# Patient Record
Sex: Female | Born: 1950 | Race: White | Hispanic: Refuse to answer | Marital: Single | Smoking: Current some day smoker
Health system: Southern US, Community
[De-identification: ages and names within clinical notes are randomized; demographics above are authoritative.]

## PROBLEM LIST (undated history)

## (undated) DIAGNOSIS — T753XXA Motion sickness, initial encounter: Secondary | ICD-10-CM

## (undated) DIAGNOSIS — R0600 Dyspnea, unspecified: Secondary | ICD-10-CM

## (undated) DIAGNOSIS — Z972 Presence of dental prosthetic device (complete) (partial): Secondary | ICD-10-CM

## (undated) HISTORY — PX: ABDOMINAL HYSTERECTOMY: SHX81

---

## 2004-12-07 ENCOUNTER — Ambulatory Visit: Payer: Self-pay | Admitting: Family Medicine

## 2004-12-29 ENCOUNTER — Ambulatory Visit: Payer: Self-pay | Admitting: Family Medicine

## 2007-03-28 IMAGING — US ABDOMEN ULTRASOUND
1 series · 17 of 25 positions shown · non-contrast
Comparison: none

REASON FOR EXAM: Abdominal pain
COMMENTS:

[Series 1: abdomen ultrasound · 17 of 57 slices shown]
[im 1/57]
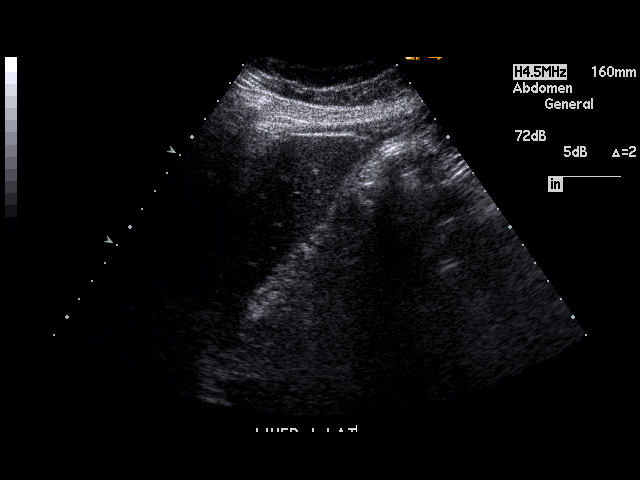
[im 5/57]
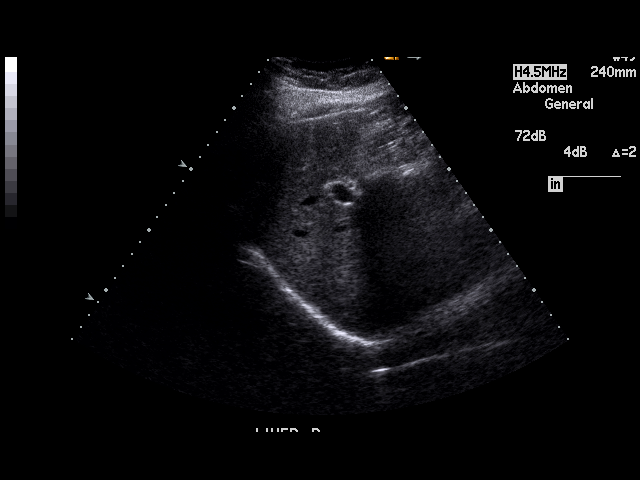
[im 8/57]
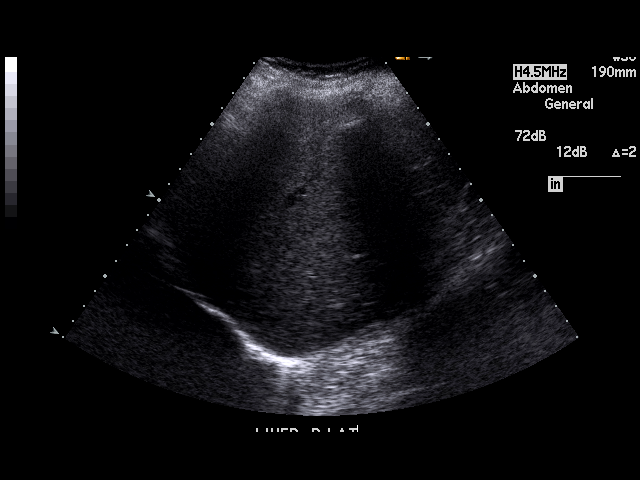
[im 12/57]
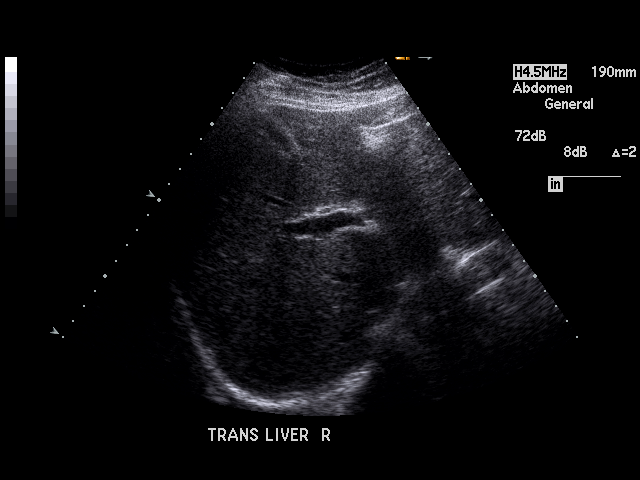
[im 15/57]
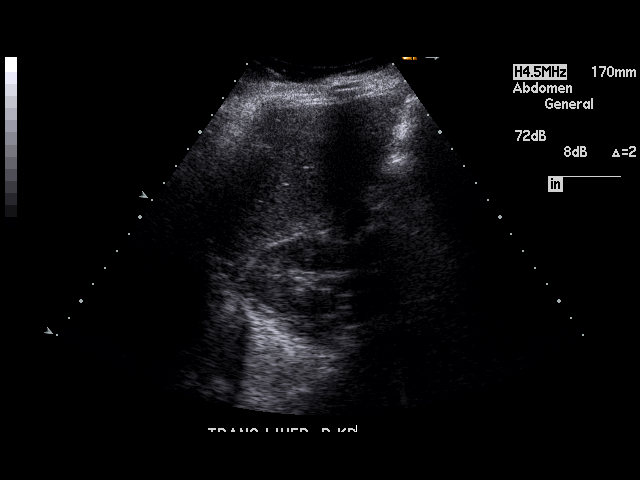
[im 19/57]
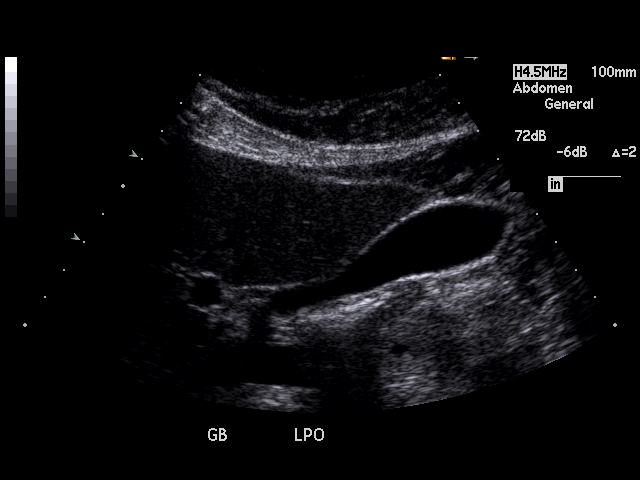
[im 22/57]
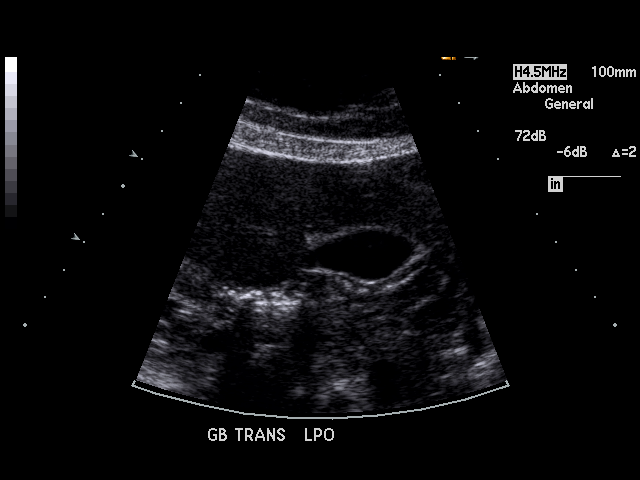
[im 26/57]
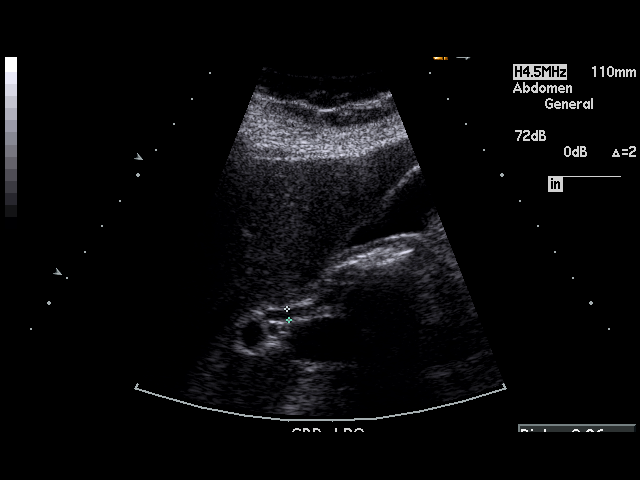
[im 29/57]
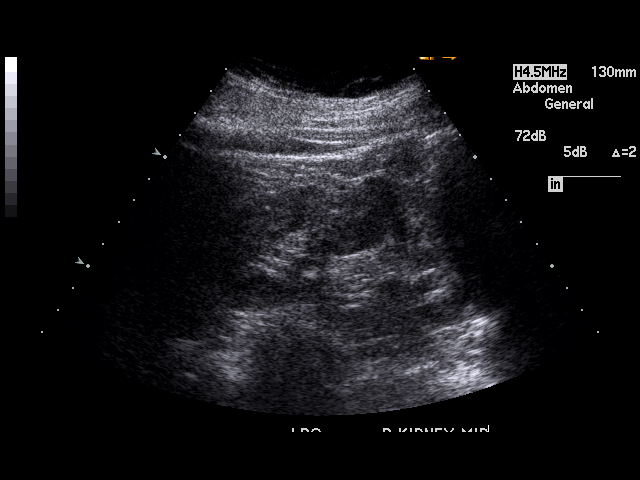
[im 31/57]
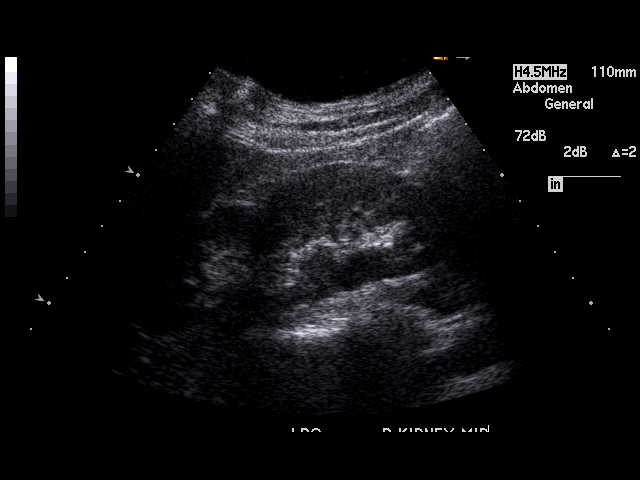
[im 36/57]
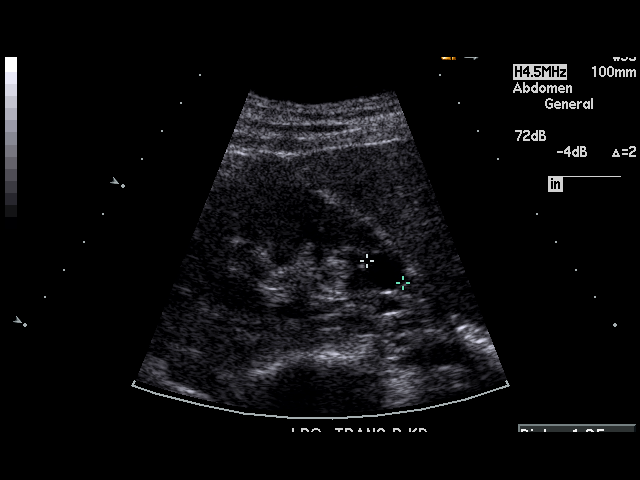
[im 38/57]
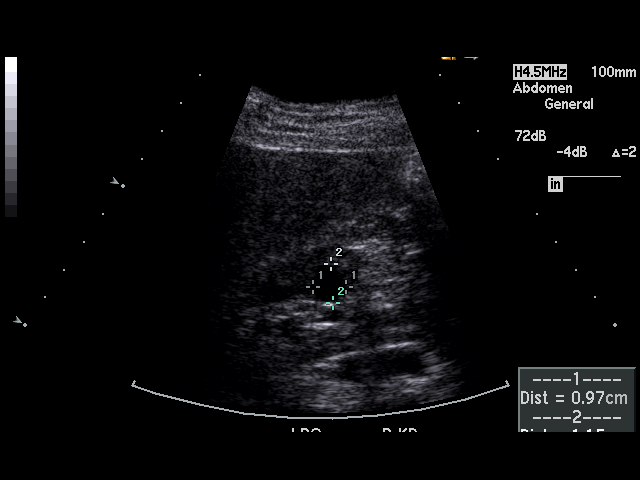
[im 43/57]
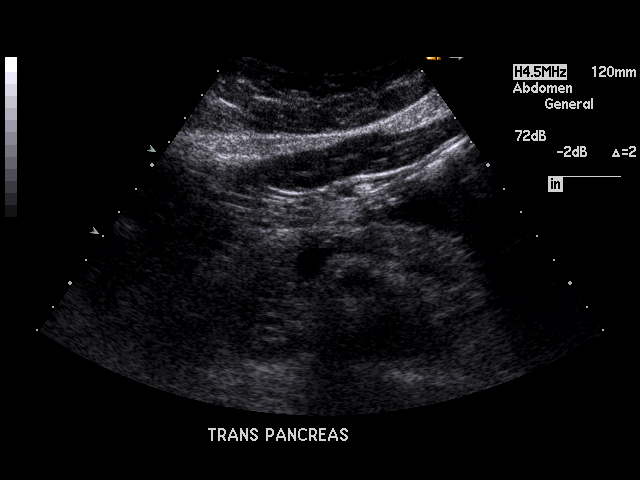
[im 45/57]
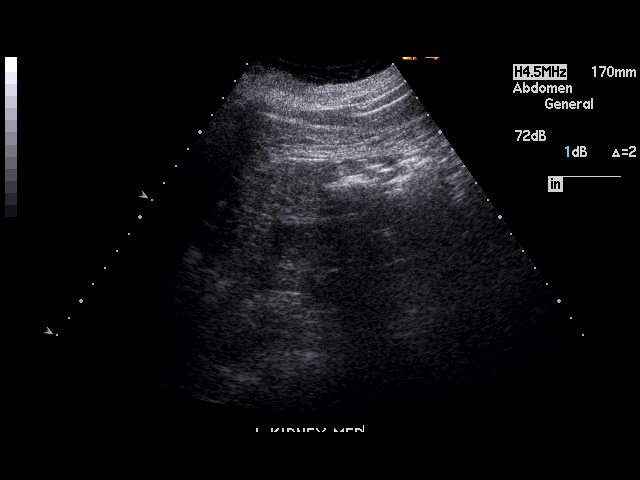
[im 50/57]
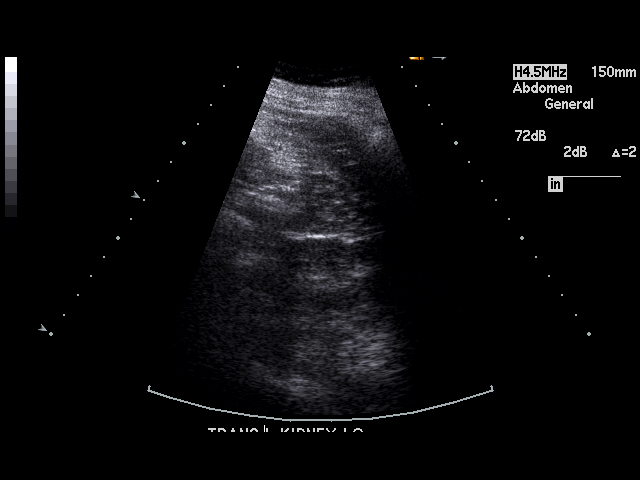
[im 52/57]
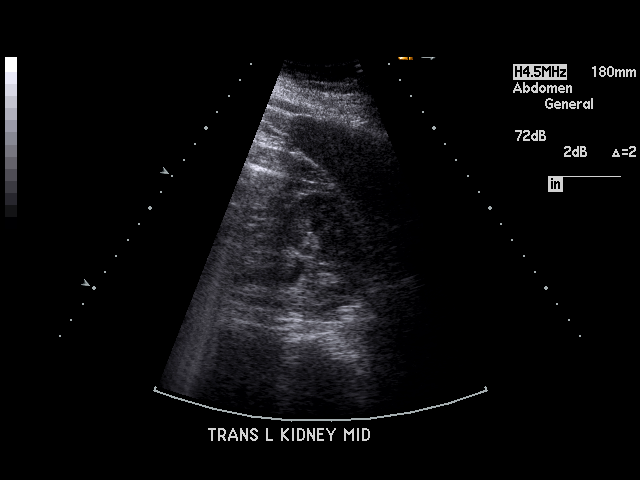
[im 57/57]
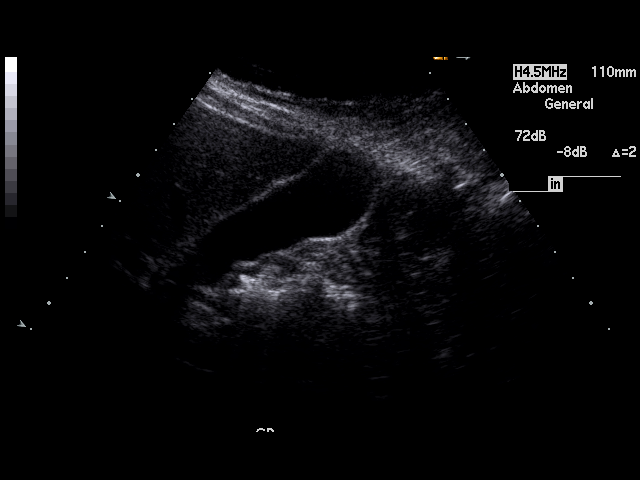

[17 of 25 positions shown; findings below may reference images not displayed]

PROCEDURE:     US  - US ABDOMEN GENERAL SURVEY  - December 29, 2004 [DATE]

RESULT:     Sonographic evaluation of the abdomen shows the gallbladder is
fluid filled and shows no focal tenderness over the gallbladder region.  The
common bile duct is normal in appearance at 3.8 mm in diameter.  The liver,
echogenicity, and size appear to be normal.  The spleen, pancreas, aorta,
and kidneys appear to be normal.  There is noted to be a cyst in the RIGHT
kidney with no definite septation.  This measures up to 1.25 cm in maximal
diameter and is seen at the lower pole.
IMPRESSION: Unremarkable abdominal sonogram.

A note is made of a renal cyst on the RIGHT as described.

## 2010-04-27 ENCOUNTER — Ambulatory Visit: Payer: Self-pay | Admitting: Family Medicine

## 2014-10-20 ENCOUNTER — Encounter: Payer: Self-pay | Admitting: Family Medicine

## 2014-10-20 ENCOUNTER — Ambulatory Visit (INDEPENDENT_AMBULATORY_CARE_PROVIDER_SITE_OTHER): Payer: Medicare Other | Admitting: Family Medicine

## 2014-10-20 VITALS — BP 130/86 | HR 72 | Ht 70.0 in | Wt 206.8 lb

## 2014-10-20 DIAGNOSIS — Z283 Underimmunization status: Secondary | ICD-10-CM

## 2014-10-20 DIAGNOSIS — F3170 Bipolar disorder, currently in remission, most recent episode unspecified: Secondary | ICD-10-CM | POA: Diagnosis not present

## 2014-10-20 DIAGNOSIS — R030 Elevated blood-pressure reading, without diagnosis of hypertension: Secondary | ICD-10-CM

## 2014-10-20 DIAGNOSIS — IMO0001 Reserved for inherently not codable concepts without codable children: Secondary | ICD-10-CM | POA: Insufficient documentation

## 2014-10-20 DIAGNOSIS — Z289 Immunization not carried out for unspecified reason: Secondary | ICD-10-CM | POA: Insufficient documentation

## 2014-10-20 DIAGNOSIS — F319 Bipolar disorder, unspecified: Secondary | ICD-10-CM | POA: Insufficient documentation

## 2014-10-20 NOTE — Progress Notes (Addendum)
Date:  10/20/2014   Name:  Tiffany Montoya   DOB:  Jul 01, 1950   MRN:  [id number]  PCP:  No primary care provider on file.    Chief Complaint: Hypertension   History of Present Illness:  This is a 64 y.o. female with PMH bipolar disorder followed by Dr. Phillips Odor at [facility]. Seen yesterday and told BP was high and could cause stroke. No prior hx HTN or other CV disease. Last tetanus unknown but wants to wait on booster until schedules PE later this summer.  Review of Systems:  Review of Systems  Patient Active Problem List   Diagnosis Date Noted  . Bipolar disorder 10/20/2014  . Elevated blood pressure 10/20/2014  . Delayed immunizations 10/20/2014    Prior to Admission medications   Medication Sig Start Date End Date Taking? Authorizing Provider  asenapine (SAPHRIS) 5 MG SUBL 24 hr tablet Place 15 mg under the tongue daily.    Yes Historical Provider, MD  clonazePAM (KLONOPIN) 1 MG tablet Take 1 mg by mouth 2 (two) times daily.   Yes Historical Provider, MD  gabapentin (NEURONTIN) 300 MG capsule Take 300 mg by mouth 2 (two) times daily.   Yes Historical Provider, MD  traZODone (DESYREL) 100 MG tablet Take 100 mg by mouth at bedtime.   Yes Historical Provider, MD    Allergies  Allergen Reactions  . Penicillins Itching  . Sulfur Itching    Past Surgical History  Procedure Laterality Date  . Abdominal hysterectomy      History  Substance Use Topics  . Smoking status: Former Research scientist (life sciences)  . Smokeless tobacco: Never Used  . Alcohol Use: No    History reviewed. No pertinent family history.  Medication list has been reviewed and updated.  Physical Examination:  Physical Exam  Constitutional: She is oriented to person, place, and time. She appears well-developed and well-nourished. No distress.  HENT:  Head: Normocephalic and atraumatic.  Right Ear: Hearing normal.  Left Ear: Hearing normal.  Nose: Nose normal.  Eyes: Conjunctivae and lids are normal. Right eye  exhibits no discharge. Left eye exhibits no discharge. No scleral icterus.  Neck: Neck supple. No thyromegaly present.  Cardiovascular: Normal rate, regular rhythm and normal heart sounds.   Pulmonary/Chest: Effort normal and breath sounds normal. No respiratory distress.  Abdominal: Soft. She exhibits no distension and no mass. There is no tenderness.  Musculoskeletal: Normal range of motion. She exhibits no edema.  Neurological: She is alert and oriented to person, place, and time.  Skin: Skin is warm, dry and intact. No lesion and no rash noted.  Psychiatric: She has a normal mood and affect. Her speech is normal and behavior is normal. Thought content normal.    BP 130/86 mmHg  Pulse 72  Ht 5\' 10"  (1.778 m)  Wt 206 lb 12.8 oz (93.804 kg)  BMI 29.67 kg/m2  Assessment and Plan:  1. Bipolar disorder in partial remission, most recent episode unspecified type Followed by Dr. Phillips Odor at [place]  2. Elevated blood pressure BP well controlled today, pt will monitor at home and bring values in for PE  3. Delayed immunizations Declines Tdap today, wants to wait until returns for PE.  [name]. [facility]  10/20/2014

## 2014-10-20 NOTE — Addendum Note (Signed)
Addended by: Adline Potter on: 10/20/2014 11:24 AM   Modules accepted: Miquel Dunn

## 2014-10-20 NOTE — Addendum Note (Signed)
Addended by: Adline Potter on: 10/20/2014 11:17 AM   Modules accepted: Miquel Dunn

## 2014-11-15 ENCOUNTER — Encounter: Payer: Medicare Other | Admitting: Family Medicine

## 2014-11-23 ENCOUNTER — Encounter: Payer: Self-pay | Admitting: Family Medicine

## 2014-11-23 ENCOUNTER — Ambulatory Visit (INDEPENDENT_AMBULATORY_CARE_PROVIDER_SITE_OTHER): Payer: Medicare Other | Admitting: Family Medicine

## 2014-11-23 VITALS — BP 132/80 | HR 80 | Resp 16 | Ht 70.0 in | Wt 209.6 lb

## 2014-11-23 DIAGNOSIS — Z23 Encounter for immunization: Secondary | ICD-10-CM | POA: Diagnosis not present

## 2014-11-23 DIAGNOSIS — E669 Obesity, unspecified: Secondary | ICD-10-CM

## 2014-11-23 DIAGNOSIS — F3170 Bipolar disorder, currently in remission, most recent episode unspecified: Secondary | ICD-10-CM | POA: Diagnosis not present

## 2014-11-23 DIAGNOSIS — R2681 Unsteadiness on feet: Secondary | ICD-10-CM | POA: Diagnosis not present

## 2014-11-23 DIAGNOSIS — Z1239 Encounter for other screening for malignant neoplasm of breast: Secondary | ICD-10-CM | POA: Diagnosis not present

## 2014-11-23 DIAGNOSIS — R030 Elevated blood-pressure reading, without diagnosis of hypertension: Secondary | ICD-10-CM | POA: Diagnosis not present

## 2014-11-23 DIAGNOSIS — IMO0001 Reserved for inherently not codable concepts without codable children: Secondary | ICD-10-CM

## 2014-11-23 DIAGNOSIS — Z0000 Encounter for general adult medical examination without abnormal findings: Secondary | ICD-10-CM | POA: Diagnosis not present

## 2014-11-23 NOTE — Progress Notes (Signed)
Date:  11/23/2014   Name:  Tiffany Montoya   DOB:  January 28, 1951   MRN:  [id number]  PCP:  Adline Potter, MD    Chief Complaint: Annual Exam   History of Present Illness:  This is a 64 y.o. female with bipolar d/o for health maintenance visit. No new problems. Last mammo long time ago normal, declines colonoscopy, needs Tdap  Review of Systems:  Review of Systems  Patient Active Problem List   Diagnosis Date Noted  . Bipolar disorder 10/20/2014    Prior to Admission medications   Medication Sig Start Date End Date Taking? Authorizing Provider  asenapine (SAPHRIS) 5 MG SUBL 24 hr tablet Place 15 mg under the tongue daily.    Yes Historical Provider, MD  clonazePAM (KLONOPIN) 1 MG tablet Take 1 mg by mouth 2 (two) times daily.   Yes Historical Provider, MD  gabapentin (NEURONTIN) 300 MG capsule Take 300 mg by mouth 2 (two) times daily.   Yes Historical Provider, MD  traZODone (DESYREL) 100 MG tablet Take 100 mg by mouth at bedtime.   Yes Historical Provider, MD    Allergies  Allergen Reactions  . Penicillins Itching  . Sulfa Antibiotics     Past Surgical History  Procedure Laterality Date  . Abdominal hysterectomy      History  Substance Use Topics  . Smoking status: Former Research scientist (life sciences)  . Smokeless tobacco: Never Used  . Alcohol Use: No    Family History  Problem Relation Age of Onset  . Hypertension Mother   . Hypertension Father     Medication list has been reviewed and updated.  Physical Examination: BP 132/80 mmHg  Pulse 80  Resp 16  Ht 5\' 10"  (1.778 m)  Wt 209 lb 9.6 oz (95.074 kg)  BMI 30.07 kg/m2  Physical Exam  Constitutional: She is oriented to person, place, and time. She appears well-developed and well-nourished.  HENT:  Head: Normocephalic and atraumatic.  Right Ear: External ear normal.  Left Ear: External ear normal.  Mouth/Throat: Oropharynx is clear and moist.  Eyes: Conjunctivae and EOM are normal. Pupils are equal, round, and reactive to  light. Right eye exhibits no discharge. Left eye exhibits no discharge. No scleral icterus.  Neck: Normal range of motion. Neck supple. No thyromegaly present.  Cardiovascular: Normal rate, regular rhythm and normal heart sounds.   Pulmonary/Chest: Effort normal and breath sounds normal.  Abdominal: Soft. She exhibits no distension and no mass. There is no tenderness.  Musculoskeletal: She exhibits no edema.  Lymphadenopathy:    She has no cervical adenopathy.  Neurological: She is alert and oriented to person, place, and time. Coordination normal.  Romberg sl positive, gait slightly wide-based  Skin: Skin is warm and dry. No rash noted.  Psychiatric: Her behavior is normal.  Flat affect    Assessment and Plan:  1. Obesity Discussed weight loss, exercise - Lipid Profile - Comprehensive Metabolic Panel (CMET) - CBC  2. Routine health maintenance Declines colonoscopy - Tdap vaccine greater than or equal to 7yo IM  3. Bipolar disorder in partial remission, most recent episode unspecified type Followed by psych - TSH  4. Elevated blood pressure Resolved today  5. Breast cancer screening Unable to order mammogram as Epic says not covered  6. Gait instability Unable to order vit D level as Epic says not covered  Return in about 4 weeks (around 12/21/2014).  Tiffany Montoya. [facility]  11/23/2014

## 2014-11-24 LAB — LIPID PANEL
Chol/HDL Ratio: 3.3 ratio units (ref 0.0–4.4)
Cholesterol, Total: 200 mg/dL — ABNORMAL HIGH (ref 100–199)
HDL: 60 mg/dL (ref >39)
LDL CALC: 118 mg/dL — AB (ref 0–99)
Triglycerides: 109 mg/dL (ref 0–149)
VLDL CHOLESTEROL CAL: 22 mg/dL (ref 5–40)

## 2014-11-24 LAB — COMPREHENSIVE METABOLIC PANEL
ALBUMIN: 4.3 g/dL (ref 3.6–4.8)
ALK PHOS: 63 IU/L (ref 39–117)
ALT: 12 IU/L (ref 0–32)
AST: 13 IU/L (ref 0–40)
Albumin/Globulin Ratio: 1.9 (ref 1.1–2.5)
BUN / CREAT RATIO: 17 (ref 11–26)
BUN: 18 mg/dL (ref 8–27)
Bilirubin Total: 0.3 mg/dL (ref 0.0–1.2)
CO2: 25 mmol/L (ref 18–29)
CREATININE: 1.03 mg/dL — AB (ref 0.57–1.00)
Calcium: 9.3 mg/dL (ref 8.7–10.3)
Chloride: 104 mmol/L (ref 97–108)
GFR calc Af Amer: 66 mL/min/{1.73_m2} (ref >59)
GFR, EST NON AFRICAN AMERICAN: 58 mL/min/{1.73_m2} — AB (ref >59)
GLOBULIN, TOTAL: 2.3 g/dL (ref 1.5–4.5)
Glucose: 95 mg/dL (ref 65–99)
Potassium: 4.5 mmol/L (ref 3.5–5.2)
SODIUM: 142 mmol/L (ref 134–144)
Total Protein: 6.6 g/dL (ref 6.0–8.5)

## 2014-11-24 LAB — CBC
Hematocrit: 40.7 % (ref 34.0–46.6)
Hemoglobin: 13.4 g/dL (ref 11.1–15.9)
MCH: 27.9 pg (ref 26.6–33.0)
MCHC: 32.9 g/dL (ref 31.5–35.7)
MCV: 85 fL (ref 79–97)
PLATELETS: 219 10*3/uL (ref 150–379)
RBC: 4.81 x10E6/uL (ref 3.77–5.28)
RDW: 14.7 % (ref 12.3–15.4)
WBC: 7.2 10*3/uL (ref 3.4–10.8)

## 2014-11-24 LAB — TSH: TSH: 1.55 u[IU]/mL (ref 0.450–4.500)

## 2014-12-02 ENCOUNTER — Other Ambulatory Visit: Payer: Self-pay | Admitting: Family Medicine

## 2014-12-02 DIAGNOSIS — Z1231 Encounter for screening mammogram for malignant neoplasm of breast: Secondary | ICD-10-CM

## 2014-12-08 ENCOUNTER — Ambulatory Visit: Payer: Self-pay

## 2014-12-21 ENCOUNTER — Ambulatory Visit: Payer: Medicare Other | Admitting: Family Medicine

## 2018-01-13 ENCOUNTER — Other Ambulatory Visit: Payer: Self-pay | Admitting: Family Medicine

## 2018-01-13 DIAGNOSIS — Z1231 Encounter for screening mammogram for malignant neoplasm of breast: Secondary | ICD-10-CM

## 2018-01-15 ENCOUNTER — Other Ambulatory Visit: Payer: Self-pay | Admitting: Nurse Practitioner

## 2018-01-15 ENCOUNTER — Ambulatory Visit
Admission: RE | Admit: 2018-01-15 | Discharge: 2018-01-15 | Disposition: A | Discharge status: Home / Self Care | Payer: Medicare Other | Source: Ambulatory Visit | Attending: Family Medicine | Admitting: Family Medicine

## 2018-01-15 DIAGNOSIS — Z1231 Encounter for screening mammogram for malignant neoplasm of breast: Secondary | ICD-10-CM | POA: Diagnosis not present

## 2018-01-15 DIAGNOSIS — Z13820 Encounter for screening for osteoporosis: Secondary | ICD-10-CM

## 2018-01-16 ENCOUNTER — Other Ambulatory Visit: Payer: Self-pay | Admitting: Family Medicine

## 2018-01-16 DIAGNOSIS — R928 Other abnormal and inconclusive findings on diagnostic imaging of breast: Secondary | ICD-10-CM

## 2018-01-23 ENCOUNTER — Other Ambulatory Visit: Payer: Self-pay

## 2018-01-23 ENCOUNTER — Encounter: Payer: Self-pay | Admitting: *Deleted

## 2018-01-23 ENCOUNTER — Encounter: Payer: Self-pay | Admitting: Nurse Practitioner

## 2018-01-23 DIAGNOSIS — R195 Other fecal abnormalities: Secondary | ICD-10-CM

## 2018-01-23 MED ORDER — NA SULFATE-K SULFATE-MG SULF 17.5-3.13-1.6 GM/177ML PO SOLN: 1.0000 | Freq: Once | ORAL | 0 refills | Status: AC

## 2018-01-23 NOTE — Discharge Instructions (Signed)
General Anesthesia, Adult, Care After °These instructions provide you with information about caring for yourself after your procedure. Your health care provider may also give you more specific instructions. Your treatment has been planned according to current medical practices, but problems sometimes occur. Call your health care provider if you have any problems or questions after your procedure. °What can I expect after the procedure? °After the procedure, it is common to have: °· Vomiting. °· A sore throat. °· Mental slowness. ° °It is common to feel: °· Nauseous. °· Cold or shivery. °· Sleepy. °· Tired. °· Sore or achy, even in parts of your body where you did not have surgery. ° °Follow these instructions at home: °For at least 24 hours after the procedure: °· Do not: °? Participate in activities where you could fall or become injured. °? Drive. °? Use heavy machinery. °? Drink alcohol. °? Take sleeping pills or medicines that cause drowsiness. °? Make important decisions or sign legal documents. °? Take care of children on your own. °· Rest. °Eating and drinking °· If you vomit, drink water, juice, or soup when you can drink without vomiting. °· Drink enough fluid to keep your urine clear or pale yellow. °· Make sure you have little or no nausea before eating solid foods. °· Follow the diet recommended by your health care provider. °General instructions °· Have a responsible adult stay with you until you are awake and alert. °· Return to your normal activities as told by your health care provider. Ask your health care provider what activities are safe for you. °· Take over-the-counter and prescription medicines only as told by your health care provider. °· If you smoke, do not smoke without supervision. °· Keep all follow-up visits as told by your health care provider. This is important. °Contact a health care provider if: °· You continue to have nausea or vomiting at home, and medicines are not helpful. °· You  cannot drink fluids or start eating again. °· You cannot urinate after 8-12 hours. °· You develop a skin rash. °· You have fever. °· You have increasing redness at the site of your procedure. °Get help right away if: °· You have difficulty breathing. °· You have chest pain. °· You have unexpected bleeding. °· You feel that you are having a life-threatening or urgent problem. °This information is not intended to replace advice given to you by your health care provider. Make sure you discuss any questions you have with your health care provider. °Document Released: 08/13/2000 Document Revised: 10/10/2015 Document Reviewed: 04/21/2015 °Elsevier Interactive Patient Education © 2018 [employer]vier Inc[employer] ° °

## 2018-01-27 ENCOUNTER — Ambulatory Visit
Admission: RE | Admit: 2018-01-27 | Discharge: 2018-01-27 | Disposition: A | Discharge status: Home / Self Care | Payer: Medicare Other | Source: Ambulatory Visit | Attending: Family Medicine | Admitting: Family Medicine

## 2018-01-27 DIAGNOSIS — R928 Other abnormal and inconclusive findings on diagnostic imaging of breast: Secondary | ICD-10-CM

## 2018-01-28 ENCOUNTER — Ambulatory Visit: Payer: Medicare Other | Admitting: Anesthesiology

## 2018-01-28 ENCOUNTER — Ambulatory Visit
Admission: RE | Admit: 2018-01-28 | Discharge: 2018-01-28 | Disposition: A | Discharge status: Home / Self Care | Payer: Medicare Other | Source: Ambulatory Visit | Attending: Gastroenterology | Admitting: Gastroenterology

## 2018-01-28 ENCOUNTER — Encounter
Admission: RE | Disposition: A | Discharge status: Home / Self Care | Payer: Self-pay | Source: Ambulatory Visit | Attending: Gastroenterology

## 2018-01-28 DIAGNOSIS — R195 Other fecal abnormalities: Secondary | ICD-10-CM | POA: Diagnosis not present

## 2018-01-28 DIAGNOSIS — F17290 Nicotine dependence, other tobacco product, uncomplicated: Secondary | ICD-10-CM | POA: Diagnosis not present

## 2018-01-28 DIAGNOSIS — D125 Benign neoplasm of sigmoid colon: Secondary | ICD-10-CM | POA: Diagnosis not present

## 2018-01-28 DIAGNOSIS — K635 Polyp of colon: Secondary | ICD-10-CM

## 2018-01-28 DIAGNOSIS — Z79899 Other long term (current) drug therapy: Secondary | ICD-10-CM | POA: Insufficient documentation

## 2018-01-28 HISTORY — PX: COLONOSCOPY WITH PROPOFOL: SHX5780

## 2018-01-28 HISTORY — DX: Dyspnea, unspecified: R06.00

## 2018-01-28 HISTORY — DX: Presence of dental prosthetic device (complete) (partial): Z97.2

## 2018-01-28 HISTORY — DX: Motion sickness, initial encounter: T75.3XXA

## 2018-01-28 SURGERY — COLONOSCOPY WITH PROPOFOL
Anesthesia: General | Site: Rectum

## 2018-01-28 MED ORDER — OXYCODONE HCL 5 MG PO TABS: 5.0000 mg | ORAL_TABLET | Freq: Once | ORAL | Status: DC | PRN

## 2018-01-28 MED ORDER — PROPOFOL 10 MG/ML IV BOLUS
INTRAVENOUS | Status: DC | PRN
  Administered 2018-01-28: 50 mg via INTRAVENOUS
  Administered 2018-01-28: 100 mg via INTRAVENOUS
  Administered 2018-01-28: 20 mg via INTRAVENOUS
  Administered 2018-01-28 (×2): 30 mg via INTRAVENOUS
  Administered 2018-01-28 (×2): 20 mg via INTRAVENOUS
  Administered 2018-01-28: 30 mg via INTRAVENOUS
  Administered 2018-01-28 (×6): 20 mg via INTRAVENOUS

## 2018-01-28 MED ORDER — OXYCODONE HCL 5 MG/5ML PO SOLN: 5.0000 mg | Freq: Once | ORAL | Status: DC | PRN

## 2018-01-28 MED ORDER — STERILE WATER FOR IRRIGATION IR SOLN
Status: DC | PRN
  Administered 2018-01-28: 11:00:00

## 2018-01-28 MED ORDER — LIDOCAINE HCL (CARDIAC) PF 100 MG/5ML IV SOSY
PREFILLED_SYRINGE | INTRAVENOUS | Status: DC | PRN
  Administered 2018-01-28: 80 mg via INTRAVENOUS

## 2018-01-28 MED ORDER — LACTATED RINGERS IV SOLN
INTRAVENOUS | Status: DC | PRN
  Administered 2018-01-28: 11:00:00 via INTRAVENOUS

## 2018-01-28 MED ORDER — LACTATED RINGERS IV SOLN: 1000.0000 mL | INTRAVENOUS | Status: DC

## 2018-01-28 SURGICAL SUPPLY — 24 items
CANISTER SUCT 1200ML W/VALVE (MISCELLANEOUS) ×3 IMPLANT
CLIP HMST 235XBRD CATH ROT (MISCELLANEOUS) IMPLANT
CLIP RESOLUTION 360 11X235 (MISCELLANEOUS)
ELECT REM PT RETURN 9FT ADLT (ELECTROSURGICAL)
ELECTRODE REM PT RTRN 9FT ADLT (ELECTROSURGICAL) IMPLANT
FCP ESCP3.2XJMB 240X2.8X (MISCELLANEOUS)
FORCEPS BIOP RAD 4 LRG CAP 4 (CUTTING FORCEPS) IMPLANT
FORCEPS BIOP RJ4 240 W/NDL (MISCELLANEOUS)
FORCEPS ESCP3.2XJMB 240X2.8X (MISCELLANEOUS) IMPLANT
GOWN CVR UNV OPN BCK APRN NK (MISCELLANEOUS) ×2 IMPLANT
GOWN ISOL THUMB LOOP REG UNIV (MISCELLANEOUS) ×4
INJECTOR VARIJECT VIN23 (MISCELLANEOUS) IMPLANT
KIT DEFENDO VALVE AND CONN (KITS) IMPLANT
KIT ENDO PROCEDURE OLY (KITS) ×3 IMPLANT
MARKER SPOT ENDO TATTOO 5ML (MISCELLANEOUS) IMPLANT
PROBE APC STR FIRE (PROBE) IMPLANT
RETRIEVER NET ROTH 2.5X230 LF (MISCELLANEOUS) IMPLANT
SNARE SHORT THROW 13M SML OVAL (MISCELLANEOUS) IMPLANT
SNARE SHORT THROW 30M LRG OVAL (MISCELLANEOUS) ×3 IMPLANT
SNARE SNG USE RND 15MM (INSTRUMENTS) IMPLANT
SPOT EX ENDOSCOPIC TATTOO (MISCELLANEOUS)
TRAP ETRAP POLY (MISCELLANEOUS) ×3 IMPLANT
VARIJECT INJECTOR VIN23 (MISCELLANEOUS)
WATER STERILE IRR 250ML POUR (IV SOLUTION) ×3 IMPLANT

## 2018-01-28 NOTE — Anesthesia Postprocedure Evaluation (Signed)
Anesthesia Post Note  Patient: Tiffany Montoya  Procedure(s) Performed: COLONOSCOPY WITH PROPOFOL WITH BIOPSY (N/A Rectum)  Patient location during evaluation: PACU Anesthesia Type: General Level of consciousness: awake and alert Pain management: pain level controlled Vital Signs Assessment: post-procedure vital signs reviewed and stable Respiratory status: spontaneous breathing Anesthetic complications: no    Jaci Standard, III,  Angelee Bahr D

## 2018-01-28 NOTE — Transfer of Care (Signed)
Immediate Anesthesia Transfer of Care Note  Patient: Tiffany Montoya  Procedure(s) Performed: COLONOSCOPY WITH PROPOFOL WITH BIOPSY (N/A Rectum)  Patient Location: PACU  Anesthesia Type: General  Level of Consciousness: awake, alert  and patient cooperative  Airway and Oxygen Therapy: Patient Spontanous Breathing and Patient connected to supplemental oxygen  Post-op Assessment: Post-op Vital signs reviewed, Patient's Cardiovascular Status Stable, Respiratory Function Stable, Patent Airway and No signs of Nausea or vomiting  Post-op Vital Signs: Reviewed and stable  Complications: No apparent anesthesia complications

## 2018-01-28 NOTE — Op Note (Signed)
[facility] Patient Name: Tiffany Montoya Procedure Date: 01/28/2018 10:51 AM MRN: [id number] Account #: [id number] Date of Birth: [birth year] Admit Type: Outpatient Age: 67 Room: [facility] OR ROOM 01 Gender: Female Note Status: Finalized Procedure:            Colonoscopy Indications:          Heme positive stool Providers:            Jasmaine Rochel B. Bonna Gains MD, MD Medicines:            Monitored Anesthesia Care Complications:        No immediate complications. Procedure:            Pre-Anesthesia Assessment:                       - ASA Grade Assessment: II - A patient with mild                        systemic disease.                       - Prior to the procedure, a History and Physical was                        performed, and patient medications, allergies and                        sensitivities were reviewed. The patient's tolerance of                        previous anesthesia was reviewed.                       - The risks and benefits of the procedure and the                        sedation options and risks were discussed with the                        patient. All questions were answered and informed                        consent was obtained.                       - Patient identification and proposed procedure were                        verified prior to the procedure by the physician, the                        nurse, the anesthesiologist, the anesthetist and the                        technician. The procedure was verified in the procedure                        room.                       After obtaining informed consent, the colonoscope was  passed under direct vision. Throughout the procedure,                        the patient's blood pressure, pulse, and oxygen                        saturations were monitored continuously. The was                        introduced through the anus and advanced to the the                  cecum, identified by appendiceal orifice and ileocecal                        valve. The colonoscopy was performed with ease. The                        patient tolerated the procedure well. The quality of                        the bowel preparation was good. Findings:      The perianal and digital rectal examinations were normal.      A 15 mm polyp was found in the sigmoid colon. The polyp was       pedunculated. The polyp was removed with a hot snare. Resection and       retrieval were complete.      The exam was otherwise without abnormality.      The rectum, sigmoid colon, descending colon, transverse colon, ascending       colon and cecum appeared normal.      The retroflexed view of the distal rectum and anal verge was normal and       showed no anal or rectal abnormalities. Impression:           - One 15 mm polyp in the sigmoid colon, removed with a                        hot snare. Resected and retrieved.                       - The examination was otherwise normal.                       - The rectum, sigmoid colon, descending colon,                        transverse colon, ascending colon and cecum are normal.                       - The distal rectum and anal verge are normal on                        retroflexion view. Recommendation:       - Discharge patient to home (with escort).                       - Advance diet as tolerated.                       - Continue present medications.                       -  Await pathology results.                       - Repeat colonoscopy date to be determined after                        pending pathology results are reviewed for surveillance.                       - The findings and recommendations were discussed with                        the patient.                       - The findings and recommendations were discussed with                        the patient's family.                       - Return to primary care  physician as previously                        scheduled. Procedure Code(s):    --- Professional ---                       [phone], Colonoscopy, flexible; with removal of tumor(s),                        polyp(s), or other lesion(s) by snare technique Diagnosis Code(s):    --- Professional ---                       D12.5, Benign neoplasm of sigmoid colon                       R19.5, Other fecal abnormalities CPT copyright 2017 American Medical Association. All rights reserved. The codes documented in this report are preliminary and upon coder review may  be revised to meet current compliance requirements.  Vonda Antigua, MD Margretta Sidle B. Bonna Gains MD, MD 01/28/2018 11:24:46 AM This report has been signed electronically. Number of Addenda: 0 Note Initiated On: 01/28/2018 10:51 AM Scope Withdrawal Time: 0 hours 19 minutes 43 seconds  Total Procedure Duration: 0 hours 23 minutes 57 seconds  Estimated Blood Loss: Estimated blood loss: none.      [facility]

## 2018-01-28 NOTE — H&P (Signed)
Vonda Antigua, MD [address] [place], [place], [place], [address] [address], [place], [place], [place], [address] Phone: [phone]  Fax: [phone]  Primary Care Physician:  Tandy Gaw, [place]   Pre-Procedure History & Physical: HPI:  Tiffany Montoya is a 67 y.o. female is here for a colonoscopy.   Past Medical History:  Diagnosis Date  . Dyspnea   . Motion sickness    boats  . Wears dentures    full upper    Past Surgical History:  Procedure Laterality Date  . ABDOMINAL HYSTERECTOMY      Prior to Admission medications   Medication Sig Start Date End Date Taking? Authorizing Provider  asenapine (SAPHRIS) 5 MG SUBL 24 hr tablet Place 15 mg under the tongue daily.    Yes [provider]  clonazePAM (KLONOPIN) 1 MG tablet Take 1 mg by mouth 2 (two) times daily.   Yes [provider]  gabapentin (NEURONTIN) 300 MG capsule Take 300 mg by mouth 2 (two) times daily.   Yes [provider]  traZODone (DESYREL) 100 MG tablet Take 100 mg by mouth at bedtime.   Yes [provider]    Allergies as of 01/23/2018 - Review Complete 01/23/2018  Allergen Reaction Noted  . Penicillins Itching 10/20/2014  . Sulfa antibiotics Itching 11/23/2014    Family History  Problem Relation Age of Onset  . Hypertension Mother   . Hypertension Father   . Breast cancer Neg Hx     Social History   Socioeconomic History  . Marital status: Single    Spouse name: Not on file  . Number of children: 3  . Years of education: GED  . Highest education level: Not on file  Occupational History    Employer: UNEMPLOYED  Social Needs  . Financial resource strain: Not on file  . Food insecurity:    Worry: Not on file    Inability: Not on file  . Transportation needs:    Medical: Not on file    Non-medical: Not on file  Tobacco Use  . Smoking status: Current Some Day Smoker  . Smokeless tobacco: Never Used  . Tobacco comment: smokes pipe  occasionally  Substance and Sexual Activity  . Alcohol use: No    Alcohol/week: 0.0 standard drinks  . Drug use: No  . Sexual activity: Never  Lifestyle  . Physical activity:    Days per week: Not on file    Minutes per session: Not on file  . Stress: Not on file  Relationships  . Social connections:    Talks on phone: Not on file    Gets together: Not on file    Attends religious service: Not on file    Active member of club or organization: Not on file    Attends meetings of clubs or organizations: Not on file    Relationship status: Not on file  . Intimate partner violence:    Fear of current or ex partner: Not on file    Emotionally abused: Not on file    Physically abused: Not on file    Forced sexual activity: Not on file  Other Topics Concern  . Not on file  Social History Narrative  . Not on file    Review of Systems: See HPI, otherwise negative ROS  Physical Exam: BP 137/75   Pulse 67   Temp (!) 97.5 F (36.4 C)   Ht 5\' 10"  (1.778 m)   Wt 94.3 kg  SpO2 98%   BMI 29.84 kg/m  General:   Alert,  pleasant and cooperative in NAD Head:  Normocephalic and atraumatic. Neck:  Supple; no masses or thyromegaly. Lungs:  Clear throughout to auscultation, normal respiratory effort.    Heart:  +S1, +S2, Regular rate and rhythm, No edema. Abdomen:  Soft, nontender and nondistended. Normal bowel sounds, without guarding, and without rebound.   Neurologic:  Alert and  oriented x4;  grossly normal neurologically.  Impression/Plan: Tiffany Montoya is here for a colonoscopy to be performed for positive fecal occult blood.   Risks, benefits, limitations, and alternatives regarding  colonoscopy have been reviewed with the patient.  Questions have been answered.  All parties agreeable.   Virgel Manifold, MD  01/28/2018, 9:00 AM

## 2018-01-28 NOTE — Anesthesia Procedure Notes (Signed)
Procedure Name: MAC Date/Time: 01/28/2018 10:49 AM Performed by: Janna Arch, CRNA Pre-anesthesia Checklist: Patient identified, Emergency Drugs available, Suction available, Timeout performed and Patient being monitored Patient Re-evaluated:Patient Re-evaluated prior to induction Oxygen Delivery Method: Nasal cannula Placement Confirmation: positive ETCO2

## 2018-01-28 NOTE — Anesthesia Preprocedure Evaluation (Signed)
Anesthesia Evaluation  Patient identified by MRN, date of birth, ID band Patient awake    Reviewed: Allergy & Precautions, H&P , NPO status , Patient's Chart, lab work & pertinent test results  Airway Mallampati: II  TM Distance: >3 FB Neck ROM: full    Dental  (+) Edentulous Upper   Pulmonary Current Smoker,   + chronic cough Pulmonary exam normal        Cardiovascular negative cardio ROS Normal cardiovascular exam     Neuro/Psych    GI/Hepatic negative GI ROS, Neg liver ROS,   Endo/Other  negative endocrine ROS  Renal/GU negative Renal ROS     Musculoskeletal   Abdominal   Peds  Hematology negative hematology ROS (+)   Anesthesia Other Findings   Reproductive/Obstetrics negative OB ROS                             Anesthesia Physical Anesthesia Plan  ASA: II  Anesthesia Plan: General   Post-op Pain Management:    Induction:   PONV Risk Score and Plan:   Airway Management Planned:   Additional Equipment:   Intra-op Plan:   Post-operative Plan:   Informed Consent: I have reviewed the patients History and Physical, chart, labs and discussed the procedure including the risks, benefits and alternatives for the proposed anesthesia with the patient or authorized representative who has indicated his/her understanding and acceptance.     Plan Discussed with:   Anesthesia Plan Comments:         Anesthesia Quick Evaluation

## 2018-01-29 ENCOUNTER — Encounter: Payer: Self-pay | Admitting: Gastroenterology

## 2018-01-31 ENCOUNTER — Encounter: Payer: Self-pay | Admitting: Gastroenterology

## 2018-02-03 ENCOUNTER — Encounter: Payer: Self-pay | Admitting: Gastroenterology

## 2018-02-10 ENCOUNTER — Telehealth: Payer: Self-pay | Admitting: Gastroenterology

## 2018-02-10 NOTE — Telephone Encounter (Signed)
Pt would like the results on colon test. Pls call patient

## 2018-02-10 NOTE — Telephone Encounter (Signed)
error 

## 2018-02-10 NOTE — Telephone Encounter (Signed)
Pt is calling to see if results for colon test are complete. Pls call

## 2018-02-12 NOTE — Telephone Encounter (Signed)
Pt notified of results and of need to have repeat colonoscopy in 3 years.

## 2019-01-27 ENCOUNTER — Ambulatory Visit (INDEPENDENT_AMBULATORY_CARE_PROVIDER_SITE_OTHER): Payer: Medicare Other

## 2019-01-27 ENCOUNTER — Encounter: Payer: Self-pay | Admitting: Podiatry

## 2019-01-27 ENCOUNTER — Ambulatory Visit (INDEPENDENT_AMBULATORY_CARE_PROVIDER_SITE_OTHER): Payer: Medicare Other | Admitting: Podiatry

## 2019-01-27 ENCOUNTER — Other Ambulatory Visit: Payer: Self-pay

## 2019-01-27 VITALS — BP 137/86 | HR 56

## 2019-01-27 DIAGNOSIS — M722 Plantar fascial fibromatosis: Secondary | ICD-10-CM | POA: Diagnosis not present

## 2019-01-27 MED ORDER — TRAMADOL HCL 50 MG PO TABS: 50.0000 mg | ORAL_TABLET | Freq: Four times a day (QID) | ORAL | 0 refills | Status: AC | PRN

## 2019-01-27 MED ORDER — METHYLPREDNISOLONE 4 MG PO TBPK: ORAL_TABLET | ORAL | 0 refills | Status: AC

## 2019-01-27 MED ORDER — MELOXICAM 15 MG PO TABS: 15.0000 mg | ORAL_TABLET | Freq: Every day | ORAL | 1 refills | Status: DC

## 2019-01-29 NOTE — Progress Notes (Signed)
   Subjective: 68 y.o. female presenting today as a new patient with a chief complaint of constant aching pain noted to the posterior right heel, medial great toe and lateral fifth digit that began a few years ago. She reports associated shooting pain that radiates up her leg. She has not done anything for treatment. There are no modifying factors noted. Patient is here for further evaluation and treatment.   Past Medical History:  Diagnosis Date  . Dyspnea   . Motion sickness    boats  . Wears dentures    full upper     Objective: Physical Exam General: The patient is alert and oriented x3 in no acute distress.  Dermatology: Skin is warm, dry and supple bilateral lower extremities. Negative for open lesions or macerations bilateral.   Vascular: Dorsalis Pedis and Posterior Tibial pulses palpable bilateral.  Capillary fill time is immediate to all digits.  Neurological: Epicritic and protective threshold intact bilateral.   Musculoskeletal: Tenderness to palpation to the plantar aspect of the right heel along the plantar fascia. All other joints range of motion within normal limits bilateral. Strength 5/5 in all groups bilateral.   Radiographic exam: Normal osseous mineralization. Joint spaces preserved. No fracture/dislocation/boney destruction. No other soft tissue abnormalities or radiopaque foreign bodies.   Assessment: 1. Plantar fasciitis right  Plan of Care:  1. Patient evaluated. Xrays reviewed.   2. Injection of 0.5cc Celestone soluspan injected into the right plantar fascia  3. Rx for Medrol Dose Pack placed 4. Rx for Meloxicam ordered for patient. 5. Rx for Tramadol ordered for patient.  6. Instructed patient regarding therapies and modalities at home to alleviate symptoms.  7. Return to clinic in 4 weeks.     Edrick Kins, DPM [facility]  Dr. Edrick Kins, DPM    [address], [place]  [address]                Office [phone]  Fax [phone]

## 2019-02-18 ENCOUNTER — Telehealth: Payer: Self-pay | Admitting: *Deleted

## 2019-02-18 ENCOUNTER — Other Ambulatory Visit: Payer: Self-pay | Admitting: Podiatry

## 2019-02-18 MED ORDER — TRAMADOL HCL 50 MG PO TABS: 50.0000 mg | ORAL_TABLET | Freq: Four times a day (QID) | ORAL | 0 refills | Status: AC | PRN

## 2019-02-18 NOTE — Telephone Encounter (Signed)
I called patient to verify medication requested, but voice mail was full and could not leave message.

## 2019-02-18 NOTE — Telephone Encounter (Signed)
Pt request refill of the tramadol.

## 2019-02-18 NOTE — Telephone Encounter (Signed)
I spoke with patient and she is requesting a refill of Tramadol.  Per Dr. Amalia Hailey verbal order, ok to give refill.  Script has been phoned into pharmacy.

## 2019-02-18 NOTE — Addendum Note (Signed)
Addended by: Graceann Congress D on: 02/18/2019 05:24 PM   Modules accepted: Orders

## 2019-03-03 ENCOUNTER — Ambulatory Visit (INDEPENDENT_AMBULATORY_CARE_PROVIDER_SITE_OTHER): Payer: Medicare Other | Admitting: Podiatry

## 2019-03-03 ENCOUNTER — Other Ambulatory Visit: Payer: Self-pay

## 2019-03-03 ENCOUNTER — Encounter: Payer: Self-pay | Admitting: Podiatry

## 2019-03-03 DIAGNOSIS — M722 Plantar fascial fibromatosis: Secondary | ICD-10-CM | POA: Diagnosis not present

## 2019-03-03 MED ORDER — TRAMADOL HCL 50 MG PO TABS: 50.0000 mg | ORAL_TABLET | Freq: Four times a day (QID) | ORAL | 0 refills | Status: AC | PRN

## 2019-03-03 MED ORDER — MELOXICAM 15 MG PO TABS: 15.0000 mg | ORAL_TABLET | Freq: Every day | ORAL | 1 refills | Status: DC

## 2019-03-06 NOTE — Progress Notes (Signed)
   Subjective: 67 y.o. female presenting today for follow up evaluation of plantar fasciitis of the right foot. She states the pain has improved but has not resolved. She reports some continued intermittent burning pain to the posterior heel. She has been using the plantar fascial brace which helps alleviate the pain. There are no worsening factors noted. Patient is here for further evaluation and treatment.   Past Medical History:  Diagnosis Date  . Dyspnea   . Motion sickness    boats  . Wears dentures    full upper     Objective: Physical Exam General: The patient is alert and oriented x3 in no acute distress.  Dermatology: Skin is warm, dry and supple bilateral lower extremities. Negative for open lesions or macerations bilateral.   Vascular: Dorsalis Pedis and Posterior Tibial pulses palpable bilateral.  Capillary fill time is immediate to all digits.  Neurological: Epicritic and protective threshold intact bilateral.   Musculoskeletal: Tenderness to palpation to the plantar aspect of the right heel along the plantar fascia. All other joints range of motion within normal limits bilateral. Strength 5/5 in all groups bilateral.    Assessment: 1. Plantar fasciitis right  Plan of Care:  1. Patient evaluated.   2. Injection of 0.5cc Celestone soluspan injected into the right plantar fascia  3. Refill prescription for Meloxicam provided to patient.  4. Refill prescription for Tramadol provided to patient.  5. Continue using plantar fascial brace.  6. Return to clinic in 4 weeks.     Edrick Kins, DPM [facility]  Dr. Edrick Kins, DPM    [address], [place] [address]                Office [phone]  Fax [phone]

## 2019-03-31 ENCOUNTER — Encounter: Payer: Self-pay | Admitting: Podiatry

## 2019-03-31 ENCOUNTER — Ambulatory Visit (INDEPENDENT_AMBULATORY_CARE_PROVIDER_SITE_OTHER): Payer: Medicare Other | Admitting: Podiatry

## 2019-03-31 ENCOUNTER — Other Ambulatory Visit: Payer: Self-pay

## 2019-03-31 DIAGNOSIS — M722 Plantar fascial fibromatosis: Secondary | ICD-10-CM

## 2019-03-31 MED ORDER — TRAMADOL HCL 50 MG PO TABS: 50.0000 mg | ORAL_TABLET | Freq: Four times a day (QID) | ORAL | 0 refills | Status: DC | PRN

## 2019-03-31 MED ORDER — MELOXICAM 15 MG PO TABS: 15.0000 mg | ORAL_TABLET | Freq: Every day | ORAL | 1 refills | Status: AC

## 2019-04-02 NOTE — Progress Notes (Signed)
Visit  Subjective: 68 y.o. female presenting today for follow up evaluation of plantar fasciitis of the right foot. She states she is doing well and has improved. She reports some continued intermittent pain but states the Tramadol and Meloxicam help alleviate it. Being on the foot for long periods of time increases the pain. Patient is here for further evaluation and treatment.   Past Medical History:  Diagnosis Date  . Dyspnea   . Motion sickness    boats  . Wears dentures    full upper     Objective: Physical Exam General: The patient is alert and oriented x3 in no acute distress.  Dermatology: Skin is warm, dry and supple bilateral lower extremities. Negative for open lesions or macerations bilateral.   Vascular: Dorsalis Pedis and Posterior Tibial pulses palpable bilateral.  Capillary fill time is immediate to all digits.  Neurological: Epicritic and protective threshold intact bilateral.   Musculoskeletal: Tenderness to palpation to the plantar aspect of the right heel along the plantar fascia. All other joints range of motion within normal limits bilateral. Strength 5/5 in all groups bilateral.    Assessment: 1. Plantar fasciitis right  Plan of Care:  1. Patient evaluated.   2. Injection of 0.5cc Celestone soluspan injected into the right plantar fascia  3. Refill prescription for Meloxicam provided to patient.  4. Refill prescription for Tramadol provided to patient.  5. Continue using plantar fascial brace.  6. Return to clinic in 2 months.     Edrick Kins, DPM [facility]  Dr. Edrick Kins, DPM    [address], [place] [address]                Office [phone]  Fax [phone]

## 2019-04-25 ENCOUNTER — Other Ambulatory Visit: Payer: Self-pay | Admitting: Podiatry

## 2019-04-27 NOTE — Telephone Encounter (Signed)
Left message for tramadol refill [phone].

## 2019-04-28 ENCOUNTER — Other Ambulatory Visit: Payer: Self-pay | Admitting: Podiatry

## 2019-06-02 ENCOUNTER — Other Ambulatory Visit: Payer: Self-pay

## 2019-06-02 ENCOUNTER — Ambulatory Visit (INDEPENDENT_AMBULATORY_CARE_PROVIDER_SITE_OTHER): Payer: Medicare Other | Admitting: Podiatry

## 2019-06-02 ENCOUNTER — Encounter: Payer: Self-pay | Admitting: Podiatry

## 2019-06-02 DIAGNOSIS — M722 Plantar fascial fibromatosis: Secondary | ICD-10-CM | POA: Diagnosis not present

## 2019-06-05 NOTE — Progress Notes (Signed)
   Subjective: 69 y.o. female presenting today for follow up evaluation of plantar fasciitis of the right foot. She reports continued intermittent pain. She has been using the plantar fascial brace and taking Meloxicam for treatment. Standing and walking increases her pain. Patient is here for further evaluation and treatment.   Past Medical History:  Diagnosis Date  . Dyspnea   . Motion sickness    boats  . Wears dentures    full upper     Objective: Physical Exam General: The patient is alert and oriented x3 in no acute distress.  Dermatology: Skin is warm, dry and supple bilateral lower extremities. Negative for open lesions or macerations bilateral.   Vascular: Dorsalis Pedis and Posterior Tibial pulses palpable bilateral.  Capillary fill time is immediate to all digits.  Neurological: Epicritic and protective threshold intact bilateral.   Musculoskeletal: Tenderness to palpation to the plantar aspect of the right heel along the plantar fascia. All other joints range of motion within normal limits bilateral. Strength 5/5 in all groups bilateral.   Assessment: 1. Plantar fasciitis right  Plan of Care:  1. Patient evaluated.  2. Declined injection.  3. Continue taking Meloxicam.  4. Continue using plantar fascial brace.  5. Patient is considering surgery if symptoms do not improve.  6. Return to clinic as needed.      Edrick Kins, DPM [facility]  Dr. Edrick Kins, DPM    [address], [place] [address]                Office [phone]  Fax [phone]

## 2020-04-25 IMAGING — US US BREAST*R* LIMITED INC AXILLA
1 series · 5 of 5 positions shown · non-contrast
Comparison: Recent screening mammogram dated 01/15/2018.

CLINICAL DATA: Patient returns today to evaluate a possible RIGHT
breast asymmetry questioned on recent screening mammogram.

EXAM:
DIGITAL DIAGNOSTIC RIGHT MAMMOGRAM WITH CAD AND TOMO
ULTRASOUND RIGHT BREAST

[Series 1: us breast*right* limited inc axilla · 0.07mm/px · 5 of 5 slices shown]
[im 1/5]
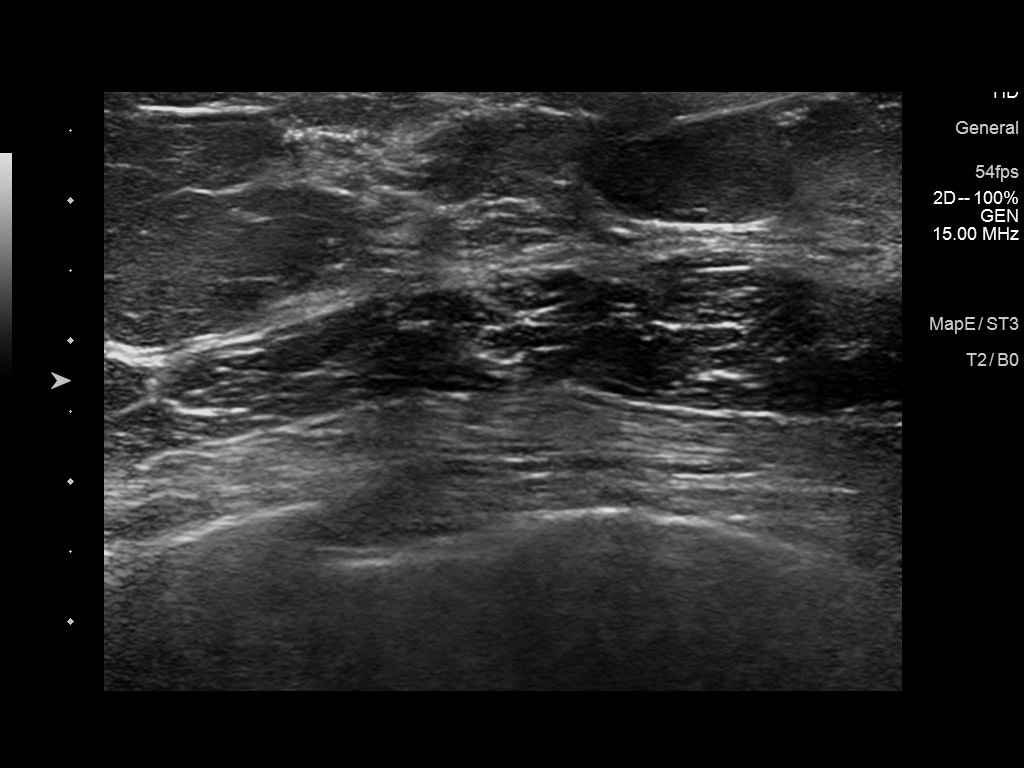
[im 2/5]
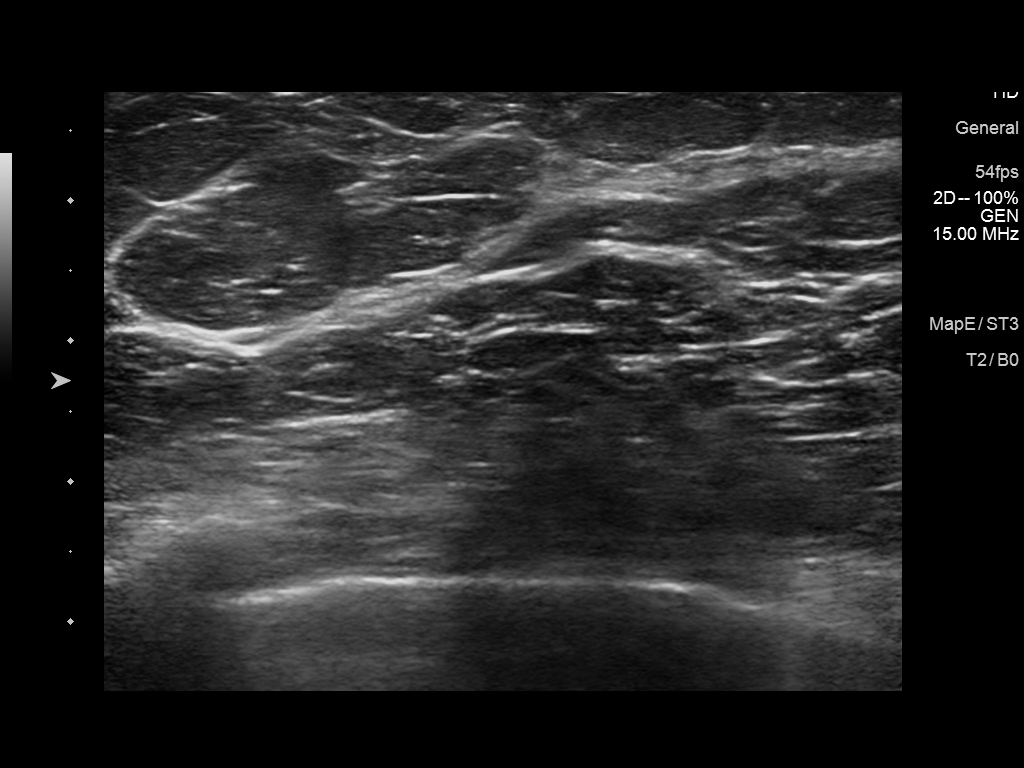
[im 3/5]
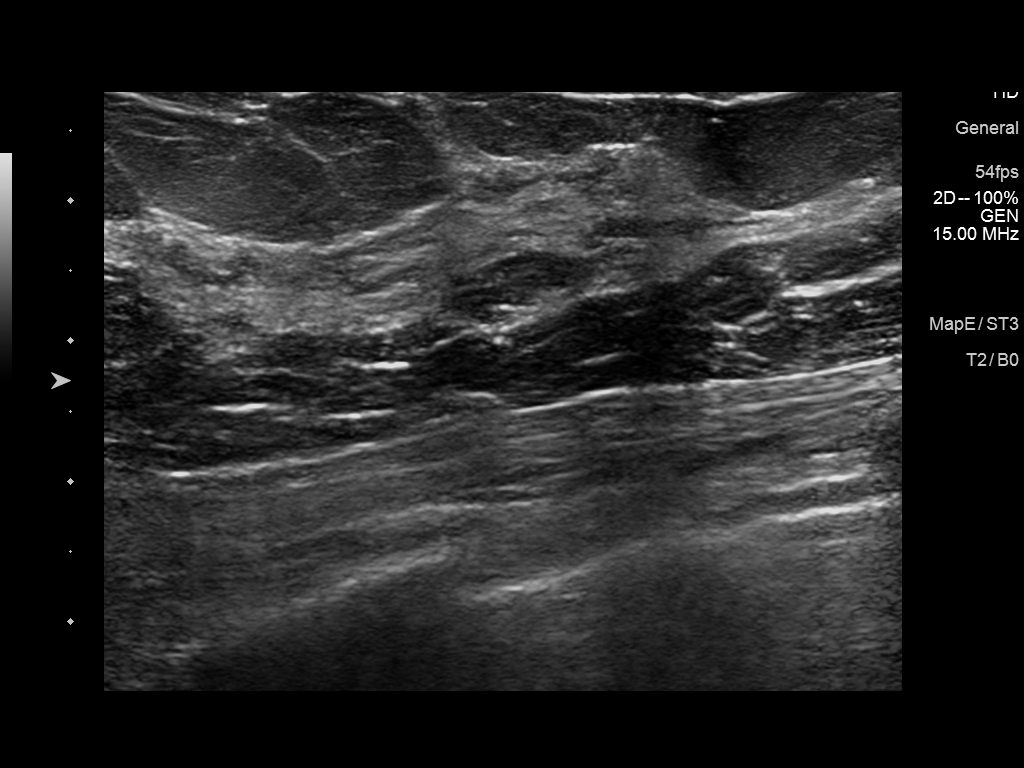
[im 4/5]
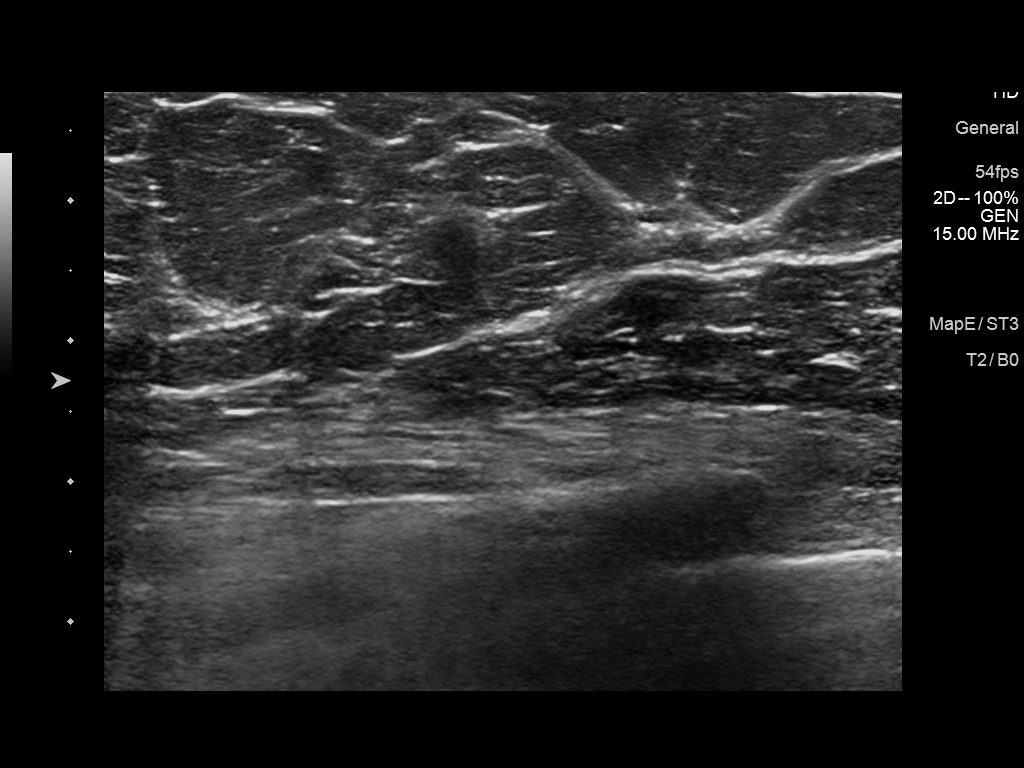
[im 5/5]
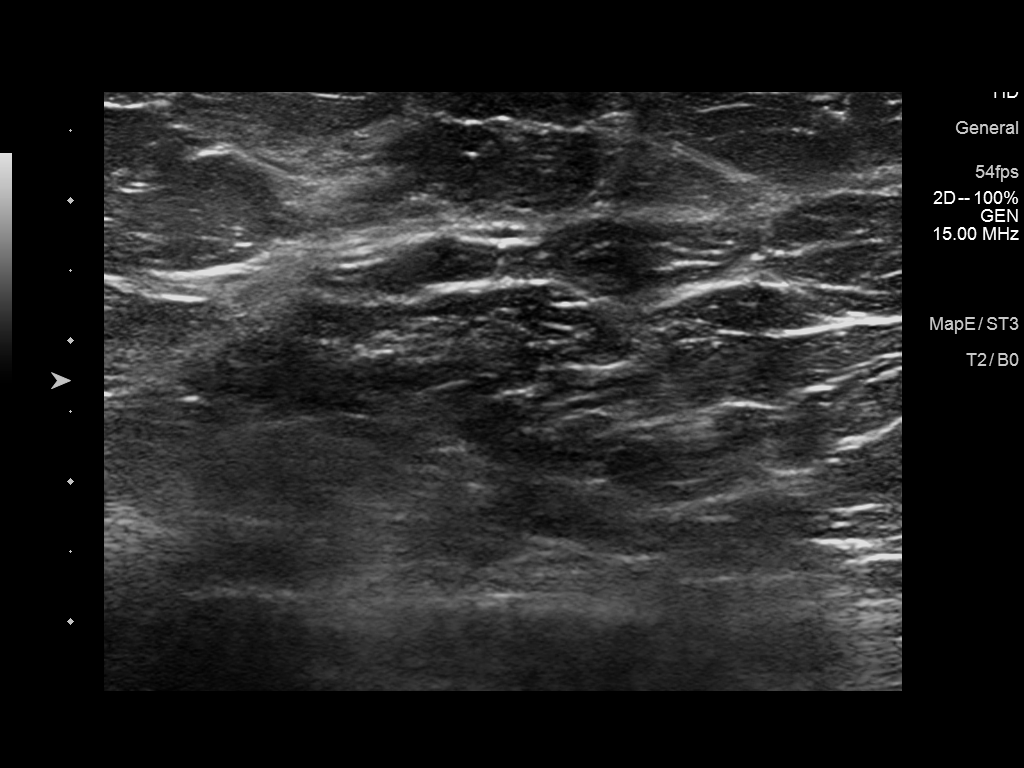

[5 of 5 positions shown; findings below may reference images not displayed]

ACR Breast Density Category b: There are scattered areas of
fibroglandular density.
FINDINGS: On today's additional diagnostic views, including spot compression
with 3D tomosynthesis, the questioned asymmetry within the slightly
outer RIGHT breast is most compatible with superimposition of normal
fibroglandular tissues. No suspicious masses, suspicious
calcifications or secondary signs of malignancy are identified on
today's exam.

Mammographic images were processed with CAD.

Targeted ultrasound is performed, evaluating the entire outer and
subareolar RIGHT breast, showing only normal fibroglandular tissues
and fat lobules throughout. No suspicious solid or cystic mass is
identified.
IMPRESSION: No evidence of malignancy within the RIGHT breast. Patient may
return to routine annual bilateral screening mammogram schedule.

RECOMMENDATION:
Screening mammogram in one year.(Code:[id number])

I have discussed the findings and recommendations with the patient.
Results were also provided in writing at the conclusion of the
visit. If applicable, a reminder letter will be sent to the patient
regarding the next appointment.

BI-RADS CATEGORY  1: Negative.

## 2020-09-21 ENCOUNTER — Other Ambulatory Visit: Payer: Self-pay | Admitting: Family Medicine

## 2020-09-21 DIAGNOSIS — Z1231 Encounter for screening mammogram for malignant neoplasm of breast: Secondary | ICD-10-CM

## 2023-02-28 ENCOUNTER — Other Ambulatory Visit: Payer: Self-pay | Admitting: Family Medicine

## 2023-02-28 DIAGNOSIS — Z1231 Encounter for screening mammogram for malignant neoplasm of breast: Secondary | ICD-10-CM

## 2023-06-13 ENCOUNTER — Ambulatory Visit: Payer: Medicare Other

## 2023-06-20 ENCOUNTER — Ambulatory Visit
Admission: RE | Admit: 2023-06-20 | Discharge: 2023-06-20 | Disposition: A | Discharge status: Home / Self Care | Payer: Medicare HMO | Source: Ambulatory Visit | Attending: Family Medicine | Admitting: Family Medicine

## 2023-06-20 DIAGNOSIS — Z1231 Encounter for screening mammogram for malignant neoplasm of breast: Secondary | ICD-10-CM | POA: Insufficient documentation

## 2024-05-04 ENCOUNTER — Other Ambulatory Visit: Payer: Self-pay | Admitting: Family Medicine

## 2024-05-04 DIAGNOSIS — Z1231 Encounter for screening mammogram for malignant neoplasm of breast: Secondary | ICD-10-CM

## 2024-06-22 ENCOUNTER — Ambulatory Visit

## 2024-07-15 ENCOUNTER — Ambulatory Visit
